# Patient Record
Sex: Female | Born: 2000 | Race: Black or African American | Hispanic: No | Smoking: Never smoker
Health system: Southern US, Community
[De-identification: ages and names within clinical notes are randomized; demographics above are authoritative.]

## PROBLEM LIST (undated history)

## (undated) DIAGNOSIS — F988 Other specified behavioral and emotional disorders with onset usually occurring in childhood and adolescence: Secondary | ICD-10-CM

## (undated) HISTORY — PX: TONSILLECTOMY: SUR1361

## (undated) HISTORY — DX: Other specified behavioral and emotional disorders with onset usually occurring in childhood and adolescence: F98.8

## (undated) HISTORY — PX: ADENOIDECTOMY: SHX5191

---
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
## 2018-04-01 ENCOUNTER — Ambulatory Visit: Payer: BC Managed Care – PPO | Admitting: Adult Health

## 2018-04-01 ENCOUNTER — Encounter: Payer: Self-pay | Admitting: Adult Health

## 2018-04-01 VITALS — BP 134/83 | HR 89 | Ht 65.0 in | Wt 243.0 lb

## 2018-04-01 DIAGNOSIS — F32A Depression, unspecified: Secondary | ICD-10-CM

## 2018-04-01 DIAGNOSIS — L678 Other hair color and hair shaft abnormalities: Secondary | ICD-10-CM | POA: Insufficient documentation

## 2018-04-01 DIAGNOSIS — F329 Major depressive disorder, single episode, unspecified: Secondary | ICD-10-CM

## 2018-04-01 DIAGNOSIS — N926 Irregular menstruation, unspecified: Secondary | ICD-10-CM | POA: Insufficient documentation

## 2018-04-01 DIAGNOSIS — R635 Abnormal weight gain: Secondary | ICD-10-CM

## 2018-04-01 NOTE — Progress Notes (Signed)
  Subjective:     Patient ID: Cheryl Byrd, female   DOB: 2001/07/25, 17 y.o.   MRN: [id number]  HPI Cheryl Byrd is a 17 year old black female in with her Mom, for complaints of weight gain about 30 lbs in last year, facial hair and irregular periods, and had hair loss. She has finished first year of college and has stopped completive dance, and Grandfather died, in last year or so. She has seen PCP and is on Wellbutrin for depression, and is better, mom says. She has seen dermatologist and endocrinologist in past. She says she started at age 17 and periods have never been exactly regular, has every month now but cycle varies.PCP thinks could be PCOS.[facility].  [facility]  Review of Systems  +facila hair +irregualr periods +weight gain Has had hair loss +derpession Reviewed past medical,surgical, social and family history. Reviewed medications and allergies.     Objective:   Physical Exam BP 134/83 (BP Location: Left Arm, Patient Position: Sitting, Cuff Size: Large)   Pulse 89   Ht 5\' 5"  (1.651 m)   Wt 243 lb (110.2 kg)   LMP 03/11/2018 (Approximate)   BMI 40.44 kg/m  Skin warm and dry. Neck: mid line trachea, normal thyroid, good ROM, no lymphadenopathy noted. Lungs: clear to ausculation bilaterally. Cardiovascular: regular rate and rhythm.Has some hair on chin,she plucks them out. PHQ 9 score 8, is on Wellbutrin for about a month now and is better, denies being suicidal. She had labs on her phone and I reviewed results, HGB 13.7,TSH 2.080,LH 3.1, Total testosterone 3.77, TPO 12,Sex binding hormone 34.1 Ferritin 46, iron 80.  Will get US to assess ovaries, and will talk when results back, discussed ovaries could be normal but have some metabolic changes going on,and may check more labs.   Face time 30 minutes with 50% counseling and reviewing records.  Assessment:     1. Irregular periods   2. Weight gain   3. Abnormal facial hair   4. Depression,  unspecified depression type       Plan:     Get GYN US in 1 week Will talk when results back, may check blood sugar and fasting insulin May try metformin and low dose OC Review handout on PCOs and diet for PCOS

## 2018-04-01 NOTE — Patient Instructions (Signed)
Diet for Polycystic Ovarian Syndrome Polycystic ovary syndrome (PCOS) is a disorder of the chemical messengers (hormones) that regulate menstruation. The condition causes important hormones to be out of balance. PCOS can:  Make your periods irregular or stop.  Cause cysts to develop on the ovaries.  Make it difficult to get pregnant.  Stop your body from responding to the effects of insulin (insulin resistance), which can lead to obesity and diabetes.  Changing what you eat can help manage PCOS and improve your health. It can help you lose weight and improve the way your body uses insulin. What is my plan?  Eat breakfast, lunch, and dinner plus two snacks every day.  Include protein in each meal and snack.  Choose whole grains instead of products made with refined flour.  Eat a variety of foods.  Exercise regularly as told by your health care provider. What do I need to know about this eating plan? If you are overweight or obese, pay attention to how many calories you eat. Cutting down on calories can help you lose weight. Work with your health care provider or dietitian to figure out how many calories you need each day. What foods can I eat? Grains Whole grains, such as whole wheat. Whole-grain breads, crackers, cereals, and pasta. Unsweetened oatmeal, bulgur, barley, quinoa, or brown rice. Corn or whole-wheat flour tortillas. Vegetables  Lettuce. Spinach. Peas. Beets. Cauliflower. Cabbage. Broccoli. Carrots. Tomatoes. Squash. Eggplant. Herbs. Peppers. Onions. Cucumbers. Brussels sprouts. Fruits Berries. Bananas. Apples. Oranges. Grapes. Papaya. Mango. Pomegranate. Kiwi. Grapefruit. Cherries. Meats and Other Protein Sources Lean proteins, such as fish, chicken, beans, eggs, and tofu. Dairy Low-fat dairy products, such as skim milk, cheese sticks, and yogurt. Beverages Low-fat or fat-free drinks, such as water, low-fat milk, sugar-free drinks, and 100% fruit  juice. Condiments Ketchup. Mustard. Barbecue sauce. Relish. Low-fat or fat-free mayonnaise. Fats and Oils Olive oil or canola oil. Walnuts and almonds. The items listed above may not be a complete list of recommended foods or beverages. Contact your dietitian for more options. What foods are not recommended? Foods high in calories or fat. Fried foods. Sweets. Products made from refined white flour, including white bread, pastries, white rice, and pasta. The items listed above may not be a complete list of foods and beverages to avoid. Contact your dietitian for more information. This information is not intended to replace advice given to you by your health care provider. Make sure you discuss any questions you have with your health care provider. Document Released: 12/20/2015 Document Revised: 02/03/2016 Document Reviewed: 09/09/2014 Elsevier Interactive Patient Education  2018 Elsevier Inc.  Polycystic Ovarian Syndrome Polycystic ovarian syndrome (PCOS) is a common hormonal disorder among women of reproductive age. In most women with PCOS, many small fluid-filled sacs (cysts) grow on the ovaries, and the cysts are not part of a normal menstrual cycle. PCOS can cause problems with your menstrual periods and make it difficult to get pregnant. It can also cause an increased risk of miscarriage with pregnancy. If it is not treated, PCOS can lead to serious health problems, such as diabetes and heart disease. What are the causes? The cause of PCOS is not known, but it may be the result of a combination of certain factors, such as:  Irregular menstrual cycle.  High levels of certain hormones (androgens).  Problems with the hormone that helps to control blood sugar (insulin resistance).  Certain genes.  What increases the risk? This condition is more likely to develop in women who   have a family history of PCOS. What are the signs or symptoms? Symptoms of PCOS may include:  Multiple ovarian  cysts.  Infrequent periods or no periods.  Periods that are too frequent or too heavy.  Unpredictable periods.  Inability to get pregnant (infertility) because of not ovulating.  Increased growth of hair on the face, chest, stomach, back, thumbs, thighs, or toes.  Acne or oily skin. Acne may develop during adulthood, and it may not respond to treatment.  Pelvic pain.  Weight gain or obesity.  Patches of thickened and dark brown or black skin on the neck, arms, breasts, or thighs (acanthosis nigricans).  Excess hair growth on the face, chest, abdomen, or upper thighs (hirsutism).  How is this diagnosed? This condition is diagnosed based on:  Your medical history.  A physical exam, including a pelvic exam. Your health care provider may look for areas of increased hair growth on your skin.  Tests, such as: ? Ultrasound. This may be used to examine the ovaries and the lining of the uterus (endometrium) for cysts. ? Blood tests. These may be used to check levels of sugar (glucose), female hormone (testosterone), and female hormones (estrogen and progesterone) in your blood.  How is this treated? There is no cure for PCOS, but treatment can help to manage symptoms and prevent more health problems from developing. Treatment varies depending on:  Your symptoms.  Whether you want to have a baby or whether you need birth control (contraception).  Treatment may include nutrition and lifestyle changes along with:  Progesterone hormone to start a menstrual period.  Birth control pills to help you have regular menstrual periods.  Medicines to make you ovulate, if you want to get pregnant.  Medicine to reduce excessive hair growth.  Surgery, in severe cases. This may involve making small holes in one or both of your ovaries. This decreases the amount of testosterone that your body produces.  Follow these instructions at home:  Take over-the-counter and prescription medicines only  as told by your health care provider.  Follow a healthy meal plan. This can help you reduce the effects of PCOS. ? Eat a healthy diet that includes lean proteins, complex carbohydrates, fresh fruits and vegetables, low-fat dairy products, and healthy fats. Make sure to eat enough fiber.  If you are overweight, lose weight as told by your health care provider. ? Losing 10% of your body weight may improve symptoms. ? Your health care provider can determine how much weight loss is best for you and can help you lose weight safely.  Keep all follow-up visits as told by your health care provider. This is important. Contact a health care provider if:  Your symptoms do not get better with medicine.  You develop new symptoms. This information is not intended to replace advice given to you by your health care provider. Make sure you discuss any questions you have with your health care provider. Document Released: 12/22/2004 Document Revised: 04/25/2016 Document Reviewed: 02/13/2016 Elsevier Interactive Patient Education  2018 Elsevier Inc.  

## 2018-04-10 ENCOUNTER — Ambulatory Visit (INDEPENDENT_AMBULATORY_CARE_PROVIDER_SITE_OTHER): Payer: BC Managed Care – PPO

## 2018-04-10 ENCOUNTER — Telehealth: Payer: Self-pay | Admitting: Adult Health

## 2018-04-10 DIAGNOSIS — N926 Irregular menstruation, unspecified: Secondary | ICD-10-CM

## 2018-04-10 MED ORDER — DESOGESTREL-ETHINYL ESTRADIOL 0.15-30 MG-MCG PO TABS: 1.0000 | ORAL_TABLET | Freq: Every day | ORAL | 4 refills | Status: DC

## 2018-04-10 MED ORDER — METFORMIN HCL 500 MG PO TABS: 500.0000 mg | ORAL_TABLET | Freq: Every day | ORAL | 1 refills | Status: DC

## 2018-04-10 NOTE — Telephone Encounter (Signed)
Pt aware that US normal, will rx apri and metformin , work on weight and F/U [place] home [place] college around [place]

## 2018-04-10 NOTE — Progress Notes (Signed)
PELVIC US TA/TV:homogeneous anteverted uterus,wnl,normal ovaries bilat,EEC 6.8 mm,ovaries appear mobile,no pain during ultrasound,small amount of cul de sac fluid

## 2018-10-03 ENCOUNTER — Other Ambulatory Visit: Payer: Self-pay | Admitting: Adult Health

## 2018-12-16 ENCOUNTER — Ambulatory Visit: Payer: BC Managed Care – PPO | Admitting: Adult Health

## 2018-12-16 ENCOUNTER — Encounter: Payer: Self-pay | Admitting: Adult Health

## 2018-12-16 ENCOUNTER — Other Ambulatory Visit: Payer: Self-pay

## 2018-12-16 VITALS — BP 125/82 | HR 92 | Temp 99.5°F | Ht 65.0 in | Wt 252.0 lb

## 2018-12-16 DIAGNOSIS — L02213 Cutaneous abscess of chest wall: Secondary | ICD-10-CM | POA: Diagnosis not present

## 2018-12-16 MED ORDER — SULFAMETHOXAZOLE-TRIMETHOPRIM 800-160 MG PO TABS
1.0000 | ORAL_TABLET | Freq: Two times a day (BID) | ORAL | 0 refills | Status: DC
Start: 2018-12-16 — End: 2022-01-27

## 2018-12-16 NOTE — Progress Notes (Signed)
Patient ID: Cheryl Byrd, female   DOB: May 03, 2001, 18 y.o.   MRN: [id number] History of Present Illness: Ty is a 18 year old black female in complaining of bump on chest between breasts, that is tender. PCP is Dr Phillips Odor.   Current Medications, Allergies, Past Medical History, Past Surgical History, Family History and Social History were reviewed in [employer] record.     Review of Systems: Has bump on chest between breasts that is tender, has had once before and it resolved.  She denies any chest pain, shortness of breath or cough.   Physical Exam:BP 125/82 (BP Location: Left Arm, Patient Position: Sitting, Cuff Size: Large)   Pulse 92   Temp 99.5 F (37.5 C)   Ht 5\' 5"  (1.651 m)   Wt 252 lb (114.3 kg)   LMP 12/09/2018   BMI 41.93 kg/m  General:  Well developed, well nourished, no acute distress Skin:  Warm and dry Chest:between breasts has 2 cm firm area that when squeezed expressed greenish fluid, culture obtained.No redness noted, it is tender.  Psych:  No mood changes, alert and cooperative,seems happy Fall risk is low.  Examination chaperoned by Federico Flake CMA.   Impression: 1. Abscess of chest wall  - Wound culture - Rx septra ds 1 bid x 14 days   Plan: Wound culture sent Meds ordered this encounter  Medications  . sulfamethoxazole-trimethoprim (BACTRIM DS,SEPTRA DS) 800-160 MG tablet    Sig: Take 1 tablet by mouth 2 (two) times daily.    Dispense:  28 tablet    Refill:  0    Order Specific Question:   Supervising Provider    Answer:   Duane Lope H [[id number]]  Use warm compresses and massage Use antibacterial soap Review handout on abscess  F/U prn, if does not resolve, will refer to surgeon for excision.

## 2018-12-16 NOTE — Patient Instructions (Signed)
Skin Abscess  A skin abscess is an infected area on or under your skin that contains a collection of pus and other material. An abscess may also be called a furuncle, carbuncle, or boil. An abscess can occur in or on almost any part of your body. Some abscesses break open (rupture) on their own. Most continue to get worse unless they are treated. The infection can spread deeper into the body and eventually into your blood, which can make you feel ill. Treatment usually involves draining the abscess. What are the causes? An abscess occurs when germs, like bacteria, pass through your skin and cause an infection. This may be caused by:  A scrape or cut on your skin.  A puncture wound through your skin, including a needle injection or insect bite.  Blocked oil or sweat glands.  Blocked and infected hair follicles.  A cyst that forms beneath your skin (sebaceous cyst) and becomes infected. What increases the risk? This condition is more likely to develop in people who:  Have a weak body defense system (immune system).  Have diabetes.  Have dry and irritated skin.  Get frequent injections or use illegal IV drugs.  Have a foreign body in a wound, such as a splinter.  Have problems with their lymph system or veins. What are the signs or symptoms? Symptoms of this condition include:  A painful, firm bump under the skin.  A bump with pus at the top. This may break through the skin and drain. Other symptoms include:  Redness surrounding the abscess site.  Warmth.  Swelling of the lymph nodes (glands) near the abscess.  Tenderness.  A sore on the skin. How is this diagnosed? This condition may be diagnosed based on:  A physical exam.  Your medical history.  A sample of pus. This may be used to find out what is causing the infection.  Blood tests.  Imaging tests, such as an ultrasound, CT scan, or MRI. How is this treated? A small abscess that drains on its own may not  need treatment. Treatment for larger abscesses may include:  Moist heat or heat pack applied to the area several times a day.  A procedure to drain the abscess (incision and drainage).  Antibiotic medicines. For a severe abscess, you may first get antibiotics through an IV and then change to antibiotics by mouth. Follow these instructions at home: Medicines   Take over-the-counter and prescription medicines only as told by your health care provider.  If you were prescribed an antibiotic medicine, take it as told by your health care provider. Do not stop taking the antibiotic even if you start to feel better. Abscess care   If you have an abscess that has not drained, apply heat to the affected area. Use the heat source that your health care provider recommends, such as a moist heat pack or a heating pad. ? Place a towel between your skin and the heat source. ? Leave the heat on for 20-30 minutes. ? Remove the heat if your skin turns bright red. This is especially important if you are unable to feel pain, heat, or cold. You may have a greater risk of getting burned.  Follow instructions from your health care provider about how to take care of your abscess. Make sure you: ? Cover the abscess with a bandage (dressing). ? Change your dressing or gauze as told by your health care provider. ? Wash your hands with soap and water before you change the   dressing or gauze. If soap and water are not available, use hand sanitizer.  Check your abscess every day for signs of a worsening infection. Check for: ? More redness, swelling, or pain. ? More fluid or blood. ? Warmth. ? More pus or a bad smell. General instructions  To avoid spreading the infection: ? Do not share personal care items, towels, or hot tubs with others. ? Avoid making skin contact with other people.  Keep all follow-up visits as told by your health care provider. This is important. Contact a health care provider if you  have:  More redness, swelling, or pain around your abscess.  More fluid or blood coming from your abscess.  Warm skin around your abscess.  More pus or a bad smell coming from your abscess.  A fever.  Muscle aches.  Chills or a general ill feeling. Get help right away if you:  Have severe pain.  See red streaks on your skin spreading away from the abscess. Summary  A skin abscess is an infected area on or under your skin that contains a collection of pus and other material.  A small abscess that drains on its own may not need treatment.  Treatment for larger abscesses may include having a procedure to drain the abscess and taking an antibiotic. This information is not intended to replace advice given to you by your health care provider. Make sure you discuss any questions you have with your health care provider. Document Released: 06/07/2005 Document Revised: 10/11/2017 Document Reviewed: 10/11/2017 Elsevier Interactive Patient Education  2019 Elsevier Inc.  

## 2018-12-18 LAB — WOUND CULTURE

## 2019-05-10 ENCOUNTER — Other Ambulatory Visit: Payer: Self-pay | Admitting: Adult Health

## 2019-05-12 ENCOUNTER — Other Ambulatory Visit: Payer: Self-pay | Admitting: *Deleted

## 2019-05-12 MED ORDER — DESOGESTREL-ETHINYL ESTRADIOL 0.15-30 MG-MCG PO TABS: 1.0000 | ORAL_TABLET | Freq: Every day | ORAL | 4 refills | Status: DC

## 2019-09-24 ENCOUNTER — Other Ambulatory Visit: Payer: Self-pay

## 2019-09-24 ENCOUNTER — Ambulatory Visit: Payer: BC Managed Care – PPO | Attending: Internal Medicine

## 2019-09-24 DIAGNOSIS — Z20822 Contact with and (suspected) exposure to covid-19: Secondary | ICD-10-CM

## 2019-09-25 LAB — NOVEL CORONAVIRUS, NAA: SARS-CoV-2, NAA: NOT DETECTED

## 2020-03-08 ENCOUNTER — Inpatient Hospital Stay (HOSPITAL_COMMUNITY): Admission: RE | Admit: 2020-03-08 | Payer: BC Managed Care – PPO | Source: Ambulatory Visit

## 2020-06-16 ENCOUNTER — Telehealth: Payer: BC Managed Care – PPO | Admitting: Adult Health

## 2020-09-23 ENCOUNTER — Other Ambulatory Visit: Payer: BC Managed Care – PPO | Admitting: Adult Health

## 2022-01-04 DIAGNOSIS — M778 Other enthesopathies, not elsewhere classified: Secondary | ICD-10-CM | POA: Insufficient documentation

## 2022-01-18 ENCOUNTER — Ambulatory Visit
Admission: EM | Admit: 2022-01-18 | Discharge: 2022-01-18 | Disposition: A | Discharge status: Home / Self Care | Payer: BC Managed Care – PPO

## 2022-01-18 ENCOUNTER — Ambulatory Visit (INDEPENDENT_AMBULATORY_CARE_PROVIDER_SITE_OTHER): Payer: BC Managed Care – PPO

## 2022-01-18 DIAGNOSIS — M25531 Pain in right wrist: Secondary | ICD-10-CM

## 2022-01-18 MED ORDER — PREDNISONE 20 MG PO TABS: 40.0000 mg | ORAL_TABLET | Freq: Every day | ORAL | 0 refills | Status: DC

## 2022-01-18 NOTE — ED Provider Notes (Signed)
?[facility] ? ? ? ?CSN: [id number] ?Arrival date & time: 01/18/22  1121 ? ? ?  ? ?History   ?Chief Complaint ?Chief Complaint  ?Patient presents with  ? Wrist Pain  ?  Right wrist pain   ? ? ?HPI ?Cheryl Byrd is a 21 y.o. female.  ? ?Presenting today with right wrist pain for the past week with no known injury.  Denies swelling, discoloration, numbness, tingling.  Significantly worse with movement, lifting.  Trying ibuprofen and Tylenol with minimal relief. ? ? ?History reviewed. No pertinent past medical history. ? ?Patient Active Problem List  ? Diagnosis Date Noted  ? Irregular periods 04/01/2018  ? Weight gain 04/01/2018  ? Abnormal facial hair 04/01/2018  ? Obesity (BMI 30.0-34.9) 01/18/2017  ? Essential tremor 10/25/2015  ? Left knee pain 05/22/2012  ? ? ?Past Surgical History:  ?Procedure Laterality Date  ? ADENOIDECTOMY    ? TONSILLECTOMY    ? ? ?OB History   ? ? Gravida  ?0  ? Para  ?0  ? Term  ?0  ? Preterm  ?0  ? AB  ?0  ? Living  ?0  ?  ? ? SAB  ?0  ? IAB  ?0  ? Ectopic  ?0  ? Multiple  ?0  ? Live Births  ?0  ?   ?  ?  ? ? ? ?Home Medications   ? ?Prior to Admission medications   ?Medication Sig Start Date End Date Taking? Authorizing Provider  ?amphetamine-dextroamphetamine (ADDERALL XR) 20 MG 24 hr capsule Take 20 mg by mouth daily.   Yes [provider]  ?predniSONE (DELTASONE) 20 MG tablet Take 2 tablets (40 mg total) by mouth daily with breakfast. 01/18/22  Yes Volney American, PA-C  ?buPROPion (WELLBUTRIN XL) 150 MG 24 hr tablet TK 1 T PO QD 03/15/18   [provider]  ?buPROPion (WELLBUTRIN XL) 300 MG 24 hr tablet Take 300 mg by mouth daily. 11/21/21   [provider]  ?desogestrel-ethinyl estradiol (APRI) 0.15-30 MG-MCG tablet Take 1 tablet by mouth daily. 05/12/19   Roma Schanz, CNM  ?iron polysaccharides (NIFEREX) 150 MG capsule Take 150 mg by mouth daily.    [provider]  ?metFORMIN (GLUCOPHAGE) 500 MG tablet TAKE 1 TABLET BY  MOUTH EVERY MORNING WITH FOOD 10/03/18   Derrek Monaco A, NP  ?sulfamethoxazole-trimethoprim (BACTRIM DS,SEPTRA DS) 800-160 MG tablet Take 1 tablet by mouth 2 (two) times daily. 12/16/18   Estill Dooms, NP  ?zonisamide (ZONEGRAN) 50 MG capsule Take 150 mg by mouth daily. 12/07/21   [provider]  ? ? ?Family History ?Family History  ?Problem Relation Age of Onset  ? Breast cancer Maternal Grandmother   ? Thyroid disease Maternal Grandmother   ? Heart attack Maternal Grandfather   ? Diabetes Maternal Grandfather   ? Thyroid disease Paternal Grandmother   ? Diabetes Paternal Grandfather   ? Thyroid disease Paternal Aunt   ? ? ?Social History ?Social History  ? ?Tobacco Use  ? Smoking status: Never  ?  Passive exposure: Never  ? Smokeless tobacco: Never  ?Vaping Use  ? Vaping Use: Never used  ?Substance Use Topics  ? Alcohol use: No  ?  Alcohol/week: 0.0 standard drinks  ? Drug use: No  ? ? ? ?Allergies   ?Patient has no known allergies. ? ? ?Review of Systems ?Review of Systems ?Per HPI ? ?Physical Exam ?Triage Vital Signs ?ED Triage Vitals  ?Enc Vitals  Group  ?   BP 01/18/22 1157 (!) 134/94  ?   Pulse Rate 01/18/22 1157 100  ?   Resp 01/18/22 1157 20  ?   Temp 01/18/22 1157 99.3 ?F (37.4 ?C)  ?   Temp Source 01/18/22 1157 Oral  ?   SpO2 01/18/22 1157 98 %  ?   Weight --   ?   Height --   ?   Head Circumference --   ?   Peak Flow --   ?   Pain Score 01/18/22 1159 7  ?   Pain Loc --   ?   Pain Edu? --   ?   Excl. in [place]? --   ? ?No data found. ? ?Updated Vital Signs ?BP (!) 134/94 (BP Location: Right Arm)   Pulse 100   Temp 99.3 ?F (37.4 ?C) (Oral)   Resp 20   LMP 01/09/2022 (Approximate)   SpO2 98%  ? ?Visual Acuity ?Right Eye Distance:   ?Left Eye Distance:   ?Bilateral Distance:   ? ?Right Eye Near:   ?Left Eye Near:    ?Bilateral Near:    ? ?Physical Exam ?Vitals and nursing note reviewed.  ?Constitutional:   ?   Appearance: Normal appearance. She is not ill-appearing.  ?HENT:  ?   Head:  Atraumatic.  ?Eyes:  ?   Extraocular Movements: Extraocular movements intact.  ?   Conjunctiva/sclera: Conjunctivae normal.  ?Cardiovascular:  ?   Rate and Rhythm: Normal rate and regular rhythm.  ?   Heart sounds: Normal heart sounds.  ?Pulmonary:  ?   Effort: Pulmonary effort is normal.  ?   Breath sounds: Normal breath sounds.  ?Musculoskeletal:     ?   General: Tenderness present. No swelling, deformity or signs of injury. Normal range of motion.  ?   Cervical back: Normal range of motion and neck supple.  ?   Comments: Diffuse tenderness to palpation right wrist and forearm.  Range of motion intact, strength full and equal bilateral hands  ?Skin: ?   General: Skin is warm and dry.  ?   Findings: No bruising or erythema.  ?Neurological:  ?   Mental Status: She is alert and oriented to person, place, and time.  ?   Motor: No weakness.  ?   Gait: Gait normal.  ?   Comments: Right upper extremity neurovascularly intact  ?Psychiatric:     ?   Mood and Affect: Mood normal.     ?   Thought Content: Thought content normal.     ?   Judgment: Judgment normal.  ? ? ? ?UC Treatments / Results  ?Labs ?(all labs ordered are listed, but only abnormal results are displayed) ?Labs Reviewed - No data to display ? ?EKG ? ? ?Radiology ?DG Wrist Complete Right ? ?Result Date: 01/18/2022 ?CLINICAL DATA:  several days of right wrist pain, no known injury EXAM: RIGHT WRIST - COMPLETE 3+ VIEW COMPARISON:  None Available. FINDINGS: No evidence of acute fracture or joint malalignment. Mild narrowing of the radiocarpal joint and mild triscaphe degenerative change. Question mild soft tissue stranding/edema. IMPRESSION: No evidence of acute bony abnormality. Electronically Signed   By: Margaretha Sheffield M.D.   On: 01/18/2022 13:08   ? ?Procedures ?Procedures (including critical care time) ? ?Medications Ordered in UC ?Medications - No data to display ? ?Initial Impression / Assessment and Plan / UC Course  ?I have reviewed the triage vital  signs and the nursing notes. ? ?  Pertinent labs & imaging results that were available during my care of the patient were reviewed by me and considered in my medical decision making (see chart for details). ? ?  ? ?X-ray of the right wrist negative for acute bony abnormality, suspect tendinitis type injury.  Treat with prednisone, Flexeril which she states she already has at home, warm Epsom salt soaks, Kinesiotape, wrist compression brace.  Discussed return precautions for acutely worsening symptoms. ? ?Final Clinical Impressions(s) / UC Diagnoses  ? ?Final diagnoses:  ?Right wrist pain  ? ?Discharge Instructions   ?None ?  ? ?ED Prescriptions   ? ? Medication Sig Dispense Auth. Provider  ? predniSONE (DELTASONE) 20 MG tablet Take 2 tablets (40 mg total) by mouth daily with breakfast. 10 tablet Volney American, PA-C  ? ?  ? ?PDMP not reviewed this encounter. ?  ?Volney American, PA-C ?01/18/22 1422 ? ?

## 2022-01-18 NOTE — ED Triage Notes (Signed)
Pt states for the past week she has not been able to put pressure on her right hand and wrist ? ?Pt states that she feels tingling going down to her elbow ? ?Denies Injury ?

## 2022-01-24 ENCOUNTER — Encounter: Payer: Self-pay | Admitting: Radiology

## 2022-01-27 ENCOUNTER — Ambulatory Visit: Payer: BC Managed Care – PPO

## 2022-01-27 ENCOUNTER — Ambulatory Visit: Payer: BC Managed Care – PPO | Admitting: Orthopedic Surgery

## 2022-01-27 ENCOUNTER — Ambulatory Visit (INDEPENDENT_AMBULATORY_CARE_PROVIDER_SITE_OTHER): Payer: BC Managed Care – PPO

## 2022-01-27 ENCOUNTER — Encounter: Payer: Self-pay | Admitting: Orthopedic Surgery

## 2022-01-27 VITALS — BP 158/100 | HR 99 | Ht 63.5 in | Wt 260.8 lb

## 2022-01-27 DIAGNOSIS — M2142 Flat foot [pes planus] (acquired), left foot: Secondary | ICD-10-CM

## 2022-01-27 DIAGNOSIS — M2141 Flat foot [pes planus] (acquired), right foot: Secondary | ICD-10-CM

## 2022-01-27 DIAGNOSIS — M79671 Pain in right foot: Secondary | ICD-10-CM

## 2022-01-27 NOTE — Patient Instructions (Signed)
Recommend prescription orthotics for bilateral flatfeet.  If you have issues obtaining the orthotics, please contact clinic.  Continue with medications as needed for your feet.  If you continue to have difficulties, I would recommend a referral to Dr. Meridee Score in [place].  Please contact the clinic if you wish to proceed with this referral.

## 2022-01-27 NOTE — Progress Notes (Signed)
New Patient Visit  Assessment: Cheryl Byrd is a 21 y.o. female with the following: 1. Pes planus of both feet  Plan: Cheryl Byrd has severe bilateral flatfoot deformity, in the setting of Ehlers-Danlos syndrome.  As has been progressively worsening for for many years.  She has tried medications, as well as some shoe modifications.  I discussed both pes planus deformity, as well as issues associated with Ehlers-Danlos.  I have recommended appropriate shoe orthotics or a referral to a foot and ankle specialist.  She would like to try the orthotics at this point.  Depending on the success of the orthotics, we will likely have to refer her to Dr. Lajoyce Corners for further evaluation.  Follow-up as needed.  Follow-up: Return if symptoms worsen or fail to improve.  Subjective:  Chief Complaint  Patient presents with   New Patient (Initial Visit)    Bilateral foot pain   Foot Pain    Bilateral/ have been painful entire life/ danced for 17 years/ has a connective tissue disorder Pain has progressively gotten worse in the last 6 mths    History of Present Illness: Cheryl Byrd is a 21 y.o. female who presents for evaluation of bilateral foot pain.  She carries a diagnosis of Ehlers-Danlos syndrome.  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  She has had flatfeet, which have progressively worsened.  She has had pain for many years.  However, over the past 6 months, they have progressively worsened.  She is on her feet throughout the day.  She has tried shoe modifications, and is taking medications as needed.  Pain is in the medial aspect of both feet.  No specific injuries.   Review of Systems: No fevers or chills No numbness or tingling No chest pain No shortness of breath No bowel or bladder dysfunction No GI distress No headaches   Medical History:  Ehlers-Danlos syndrome  Past Surgical History:  Procedure Laterality Date   ADENOIDECTOMY     TONSILLECTOMY       Family History  Problem Relation Age of Onset   Breast cancer Maternal Grandmother    Thyroid disease Maternal Grandmother    Heart attack Maternal Grandfather    Diabetes Maternal Grandfather    Thyroid disease Paternal Grandmother    Diabetes Paternal Grandfather    Thyroid disease Paternal Aunt    Social History   Tobacco Use   Smoking status: Never    Passive exposure: Never   Smokeless tobacco: Never  Vaping Use   Vaping Use: Never used  Substance Use Topics   Alcohol use: No    Alcohol/week: 0.0 standard drinks   Drug use: No    No Known Allergies  No outpatient medications have been marked as taking for the 01/27/22 encounter (Office Visit) with Oliver Barre, MD.    Objective: BP (!) 158/100   Pulse 99   Ht 5' 3.5" (1.613 m)   Wt 260 lb 12.8 oz (118.3 kg)   LMP 01/09/2022 (Approximate)   BMI 45.47 kg/m   Physical Exam:  General: Alert and oriented. and No acute distress. Gait: Normal gait.  Evaluation of both feet demonstrates severe flatfoot deformity.  Range of motion about the ankle is good.  Tenderness palpation over the medial midfoot bilaterally.  Toes are warm and well-perfused.  Sensation is intact over the dorsum of the foot.  IMAGING: I personally ordered and reviewed the following images  Standing x-rays of bilateral feet were  obtained in clinic today.  No acute injuries are noted.  No dislocations.  She has severe flatfoot deformities bilaterally.  Impression: Bilateral pes planus deformity   New Medications:  No orders of the defined types were placed in this encounter.     Oliver Barre, MD  01/27/2022 10:06 PM

## 2022-07-27 ENCOUNTER — Telehealth: Payer: Self-pay | Admitting: Physician Assistant

## 2022-07-27 ENCOUNTER — Encounter: Payer: Self-pay | Admitting: Physician Assistant

## 2022-07-27 DIAGNOSIS — J208 Acute bronchitis due to other specified organisms: Secondary | ICD-10-CM

## 2022-07-28 MED ORDER — PROMETHAZINE-DM 6.25-15 MG/5ML PO SYRP: 5.0000 mL | ORAL_SOLUTION | Freq: Four times a day (QID) | ORAL | 0 refills | Status: DC | PRN

## 2022-07-28 MED ORDER — BENZONATATE 100 MG PO CAPS: 100.0000 mg | ORAL_CAPSULE | Freq: Three times a day (TID) | ORAL | 0 refills | Status: DC | PRN

## 2022-07-28 MED ORDER — PREDNISONE 10 MG (21) PO TBPK: ORAL_TABLET | ORAL | 0 refills | Status: DC

## 2022-07-28 NOTE — Progress Notes (Signed)
Duplicate encounter

## 2022-07-28 NOTE — Telephone Encounter (Signed)
This encounter was created in error - please disregard.

## 2022-07-28 NOTE — Progress Notes (Signed)
We are sorry that you are not feeling well.  Here is how we plan to help!  Based on your presentation I believe you most likely have A cough due to a virus.  This is called viral bronchitis and is best treated by rest, plenty of fluids and control of the cough.  You may use Ibuprofen or Tylenol as directed to help your symptoms.     In addition you may use A prescription cough medication called Tessalon Perles 100mg. You may take 1-2 capsules every 8 hours as needed for your cough. And Promethazine DM cough syrup Take 5mL every 6 hours as needed for cough  Prednisone 10 mg daily for 6 days (see taper instructions below)  Directions for 6 day taper: Day 1: 2 tablets before breakfast, 1 after both lunch & dinner and 2 at bedtime Day 2: 1 tab before breakfast, 1 after both lunch & dinner and 2 at bedtime Day 3: 1 tab at each meal & 1 at bedtime Day 4: 1 tab at breakfast, 1 at lunch, 1 at bedtime Day 5: 1 tab at breakfast & 1 tab at bedtime Day 6: 1 tab at breakfast  From your responses in the eVisit questionnaire you describe inflammation in the upper respiratory tract which is causing a significant cough.  This is commonly called Bronchitis and has four common causes:   Allergies Viral Infections Acid Reflux Bacterial Infection Allergies, viruses and acid reflux are treated by controlling symptoms or eliminating the cause. An example might be a cough caused by taking certain blood pressure medications. You stop the cough by changing the medication. Another example might be a cough caused by acid reflux. Controlling the reflux helps control the cough.  USE OF BRONCHODILATOR ("RESCUE") INHALERS: There is a risk from using your bronchodilator too frequently.  The risk is that over-reliance on a medication which only relaxes the muscles surrounding the breathing tubes can reduce the effectiveness of medications prescribed to reduce swelling and congestion of the tubes themselves.  Although you feel  brief relief from the bronchodilator inhaler, your asthma may actually be worsening with the tubes becoming more swollen and filled with mucus.  This can delay other crucial treatments, such as oral steroid medications. If you need to use a bronchodilator inhaler daily, several times per day, you should discuss this with your provider.  There are probably better treatments that could be used to keep your asthma under control.     HOME CARE Only take medications as instructed by your medical team. Complete the entire course of an antibiotic. Drink plenty of fluids and get plenty of rest. Avoid close contacts especially the very young and the elderly Cover your mouth if you cough or cough into your sleeve. Always remember to wash your hands A steam or ultrasonic humidifier can help congestion.   GET HELP RIGHT AWAY IF: You develop worsening fever. You become short of breath You cough up blood. Your symptoms persist after you have completed your treatment plan MAKE SURE YOU  Understand these instructions. Will watch your condition. Will get help right away if you are not doing well or get worse.    Thank you for choosing an e-visit.  Your e-visit answers were reviewed by a board certified advanced clinical practitioner to complete your personal care plan. Depending upon the condition, your plan could have included both over the counter or prescription medications.  Please review your pharmacy choice. Make sure the pharmacy is open so you can   pick up prescription now. If there is a problem, you may contact your provider through MyChart messaging and have the prescription routed to another pharmacy.  Your safety is important to us. If you have drug allergies check your prescription carefully.   For the next 24 hours you can use MyChart to ask questions about today's visit, request a non-urgent call back, or ask for a work or school excuse. You will get an email in the next two days asking  about your experience. I hope that your e-visit has been valuable and will speed your recovery.  I have spent 5 minutes in review of e-visit questionnaire, review and updating patient chart, medical decision making and response to patient.   Denese Mentink M Mathius Birkeland, PA-C  

## 2022-08-04 ENCOUNTER — Telehealth: Payer: Self-pay | Admitting: Physician Assistant

## 2022-08-04 DIAGNOSIS — J019 Acute sinusitis, unspecified: Secondary | ICD-10-CM

## 2022-08-04 DIAGNOSIS — B9689 Other specified bacterial agents as the cause of diseases classified elsewhere: Secondary | ICD-10-CM

## 2022-08-04 MED ORDER — AMOXICILLIN-POT CLAVULANATE 875-125 MG PO TABS: 1.0000 | ORAL_TABLET | Freq: Two times a day (BID) | ORAL | 0 refills | Status: DC

## 2022-08-04 NOTE — Progress Notes (Signed)

## 2022-11-09 ENCOUNTER — Encounter: Payer: Self-pay | Admitting: Radiology

## 2022-11-10 HISTORY — PX: WISDOM TOOTH EXTRACTION: SHX21

## 2023-09-03 ENCOUNTER — Inpatient Hospital Stay (HOSPITAL_COMMUNITY): Admission: RE | Admit: 2023-09-03 | Payer: Self-pay | Source: Ambulatory Visit

## 2023-09-03 DIAGNOSIS — Z1231 Encounter for screening mammogram for malignant neoplasm of breast: Secondary | ICD-10-CM

## 2023-09-20 NOTE — Progress Notes (Signed)
  Intake history:  BP (!) 133/93   Pulse 93   Ht 5' 5 (1.651 m)   Wt 263 lb (119.3 kg)   BMI 43.77 kg/m  Body mass index is 43.77 kg/m.    WHAT ARE WE SEEING YOU FOR TODAY has back and leg pain bilaterla   Both legs per patient history has seen Dr Irving at [facility] ortho and dx with Mertha / Danlos syndrom no records available for review   How long has this bothered you?   Long duration   Anticoag.  No  Diabetes No  Heart disease No  Hypertension No  SMOKING HX No  Kidney disease No  Any ALLERGIES ______________________________________________   Treatment:  Have you taken:  Tylenol No  Advil No  Had PT No  Had injection No  Other  ____tried Baclofen and Flexeril but both made her sleepy _____________________

## 2023-09-24 ENCOUNTER — Other Ambulatory Visit (INDEPENDENT_AMBULATORY_CARE_PROVIDER_SITE_OTHER): Payer: BC Managed Care – PPO

## 2023-09-24 ENCOUNTER — Ambulatory Visit: Payer: 59 | Admitting: Orthopedic Surgery

## 2023-09-24 VITALS — BP 133/93 | HR 93 | Ht 65.0 in | Wt 263.0 lb

## 2023-09-24 DIAGNOSIS — M545 Low back pain, unspecified: Secondary | ICD-10-CM | POA: Diagnosis not present

## 2023-09-24 DIAGNOSIS — M25551 Pain in right hip: Secondary | ICD-10-CM | POA: Diagnosis not present

## 2023-09-24 DIAGNOSIS — M25552 Pain in left hip: Secondary | ICD-10-CM | POA: Diagnosis not present

## 2023-09-24 NOTE — Patient Instructions (Signed)
 Physical therapy has been ordered for you at [facility]. They should call you to schedule, [phone] is the phone number to call, if you want to call to schedule.

## 2023-09-24 NOTE — Progress Notes (Signed)
 Subjective:    Patient ID: Cheryl Byrd, female    DOB: 24-Sep-2000, 23 y.o.   MRN: [id number]   Intake history:   BP (!) 133/93   Pulse 93   Ht 5' 5 (1.651 m)   Wt 263 lb (119.3 kg)   BMI 43.77 kg/m  Body mass index is 43.77 kg/m.       WHAT ARE WE SEEING YOU FOR TODAY has back and leg pain bilaterla    Both legs per patient history has seen Dr Irving at [facility] ortho and dx with Mertha / Danlos syndrom no records available for review    How long has this bothered you?    Long duration    Anticoag.  No   Diabetes No   Heart disease No   Hypertension No   SMOKING HX No   Kidney disease No   Any ALLERGIES ______________________________________________     Treatment:  Have you taken:  Tylenol No  Advil No  Had PT No  Had injection No  Other  ____tried Baclofen and Flexeril but both made her sleepy _____________________ 23 year old female with Erler's Danlos syndrome comes in with complaints of back and hip pain with increased pain with Valsalva maneuver when having bowel movements.  Had some brief episodes of bilateral leg pain in the posterior aspects of the thighs which has subsequently localized to the left hip  No prior treatment other than some baclofen and Flexeril        Review of Systems  Constitutional:  Negative for fever and unexpected weight change.  Gastrointestinal: Negative.   Genitourinary: Negative.   Neurological:  Negative for numbness.       Objective:   Physical Exam Vitals and nursing note reviewed.  Constitutional:      Appearance: Normal appearance.  HENT:     Head: Normocephalic and atraumatic.  Eyes:     General: No scleral icterus.       Right eye: No discharge.        Left eye: No discharge.     Extraocular Movements: Extraocular movements intact.     Conjunctiva/sclera: Conjunctivae normal.     Pupils: Pupils are equal, round, and reactive to light.  Cardiovascular:     Rate and Rhythm: Normal rate.      Pulses: Normal pulses.  Musculoskeletal:     Lumbar back: Tenderness present. No swelling, edema, deformity, signs of trauma, lacerations, spasms or bony tenderness. Normal range of motion. Negative right straight leg raise test and negative left straight leg raise test. No scoliosis.     Comments: A little tenderness on the left side of the lumbar spine  Skin:    General: Skin is warm and dry.     Capillary Refill: Capillary refill takes less than 2 seconds.  Neurological:     General: No focal deficit present.     Mental Status: She is alert and oriented to person, place, and time.  Psychiatric:        Mood and Affect: Mood normal.        Behavior: Behavior normal.        Thought Content: Thought content normal.        Judgment: Judgment normal.     BP (!) 133/93   Pulse 93   Ht 5' 5 (1.651 m)   Wt 263 lb (119.3 kg)   BMI 43.77 kg/m         Assessment & Plan:   Encounter Diagnoses  Name  Primary?   Lumbar pain Yes   Hip pain, bilateral    Mechanical low back pain recommend physical therapy follow-up as needed

## 2023-11-27 ENCOUNTER — Telehealth: Payer: Self-pay | Admitting: Plastic Surgery

## 2023-11-27 NOTE — Telephone Encounter (Signed)
 Provider out of office 01-01-24, called and lvmail to r/s

## 2023-11-28 ENCOUNTER — Telehealth: Payer: Self-pay | Admitting: Plastic Surgery

## 2023-11-28 NOTE — Telephone Encounter (Signed)
 Called and left a VM.

## 2023-12-04 ENCOUNTER — Telehealth: Payer: Self-pay | Admitting: Plastic Surgery

## 2023-12-04 NOTE — Telephone Encounter (Signed)
 Called 3-25, left a VM, provider not in office

## 2023-12-11 ENCOUNTER — Telehealth: Payer: Self-pay | Admitting: Plastic Surgery

## 2023-12-11 NOTE — Telephone Encounter (Signed)
 pt has been called 3x, provider not in the office

## 2024-01-01 ENCOUNTER — Institutional Professional Consult (permissible substitution): Payer: 59 | Admitting: Plastic Surgery

## 2024-02-04 ENCOUNTER — Other Ambulatory Visit: Payer: Self-pay | Admitting: Medical Genetics

## 2024-02-05 ENCOUNTER — Other Ambulatory Visit (HOSPITAL_COMMUNITY)
Admission: RE | Admit: 2024-02-05 | Discharge: 2024-02-05 | Disposition: A | Discharge status: Home / Self Care | Payer: Self-pay | Source: Ambulatory Visit | Attending: Oncology | Admitting: Oncology

## 2024-02-12 LAB — GENECONNECT MOLECULAR SCREEN: Genetic Analysis Overall Interpretation: NEGATIVE

## 2024-05-06 ENCOUNTER — Encounter: Payer: Self-pay | Admitting: Adult Health

## 2024-05-06 ENCOUNTER — Ambulatory Visit: Admitting: Adult Health

## 2024-05-06 ENCOUNTER — Other Ambulatory Visit (HOSPITAL_COMMUNITY)
Admission: RE | Admit: 2024-05-06 | Discharge: 2024-05-06 | Disposition: A | Discharge status: Home / Self Care | Source: Ambulatory Visit | Attending: Adult Health | Admitting: Adult Health

## 2024-05-06 VITALS — BP 147/79 | HR 93 | Ht 65.0 in | Wt 291.5 lb

## 2024-05-06 DIAGNOSIS — Z124 Encounter for screening for malignant neoplasm of cervix: Secondary | ICD-10-CM | POA: Insufficient documentation

## 2024-05-06 DIAGNOSIS — R030 Elevated blood-pressure reading, without diagnosis of hypertension: Secondary | ICD-10-CM

## 2024-05-06 DIAGNOSIS — N926 Irregular menstruation, unspecified: Secondary | ICD-10-CM | POA: Diagnosis not present

## 2024-05-06 DIAGNOSIS — F419 Anxiety disorder, unspecified: Secondary | ICD-10-CM

## 2024-05-06 DIAGNOSIS — N898 Other specified noninflammatory disorders of vagina: Secondary | ICD-10-CM | POA: Diagnosis present

## 2024-05-06 DIAGNOSIS — F32A Depression, unspecified: Secondary | ICD-10-CM | POA: Diagnosis not present

## 2024-05-06 DIAGNOSIS — Z01419 Encounter for gynecological examination (general) (routine) without abnormal findings: Secondary | ICD-10-CM | POA: Diagnosis present

## 2024-05-06 DIAGNOSIS — Z1151 Encounter for screening for human papillomavirus (HPV): Secondary | ICD-10-CM | POA: Diagnosis not present

## 2024-05-06 MED ORDER — FLUCONAZOLE 150 MG PO TABS: ORAL_TABLET | ORAL | 1 refills | Status: AC

## 2024-05-06 NOTE — Progress Notes (Signed)
 Subjective:     Patient ID: Cheryl Byrd, female   DOB: 2001/02/26, 23 y.o.   MRN: [id number]  HPI Cheryl Byrd is a 23 year old black female, with SO, G0P0, in complaining of irregular periods, varies 3-6 weeks from 1st day to 1st day. And skipped June. She also has itching before her periods in clitoral area.  She needs a pap, this will be her first. Stressed at work.  PCP is Dr Marvine.   Review of Systems +irregular periods, varies 3-6 weeks from 1st day to 1st day. And skipped June. + itching before her periods in clitoral area.   +Stressed at work.   Reviewed past medical,surgical, social and family history. Reviewed medications and allergies.  Objective:   Physical Exam BP (!) 147/79 (BP Location: Right Arm, Patient Position: Sitting, Cuff Size: Large)   Pulse 93   Ht 5' 5 (1.651 m)   Wt 291 lb 8 oz (132.2 kg)   LMP 04/20/2024 (Exact Date)   BMI 48.51 kg/m     Skin warm and dry.  Lungs: clear to ausculation bilaterally. Cardiovascular: regular rate and rhythm.  Pelvic: external genitalia is normal in appearance no lesions, vagina: at clitoral hood, swab obtained,urethra has no lesions or masses noted, cervix:smooth, pap with GC/CHL, and HR HPV genotyping performed, uterus: normal size, shape and contour, non tender, no masses felt, adnexa: no masses or tenderness noted. Bladder is non tender and no masses felt.  AA is 0 Fall risk is low    05/06/2024   10:35 AM 04/01/2018   11:34 AM 04/01/2018   11:33 AM  Depression screen PHQ 2/9  Decreased Interest 2 1 1   Down, Depressed, Hopeless 1 2 2   PHQ - 2 Score 3 3 3   Altered sleeping 2 1   Tired, decreased energy 2 2   Change in appetite 1 1   Feeling bad or failure about yourself  1 0   Trouble concentrating 2 1   Moving slowly or fidgety/restless 1 0   Suicidal thoughts 0 0   PHQ-9 Score 12 8   Difficult doing work/chores  Not difficult at all        05/06/2024   10:35 AM  GAD 7 : Generalized Anxiety Score  Nervous,  Anxious, on Edge 1  Control/stop worrying 0  Worry too much - different things 2  Trouble relaxing 1  Restless 1  Easily annoyed or irritable 2  Afraid - awful might happen 0  Total GAD 7 Score 7    Upstream - 05/06/24 1026       Pregnancy Intention Screening   Does the patient want to become pregnant in the next year? Unsure    Does the patient's partner want to become pregnant in the next year? Unsure    Would the patient like to discuss contraceptive options today? No      Contraception Wrap Up   Current Method No Method - Other Reason    End Method No Method - Other Reason         Examination chaperoned by Clarita Salt LPN    Assessment:     1. Routine Papanicolaou smear Pap sent Pap in 3 years if normal - Cytology - PAP( [place])  2. Irregular periods (Primary) Cycles vary between 3-6 weeks, discussed this is normal cycle, 21-35 days  Will monitor for now  3. Itching in the vaginal area +itching before period +white discharge at clitoral hood Will rx diflucan  to take before  period if needed  Meds ordered this encounter  Medications   fluconazole  (DIFLUCAN ) 150 MG tablet    Sig: Take 1 po before period if needed    Dispense:  2 tablet    Refill:  1    Supervising Provider:   JAYNE MINDER H [[id number]]    - Cervicovaginal ancillary only( [place])  4. Elevated BP without diagnosis of hypertension Keep check on BP and watch salt and sugars and follow up with PCP  5. Anxiety and depression She has rx from PCP but not taking currently  Does have stress at work Plan:     Physical in 1 year

## 2024-05-07 ENCOUNTER — Ambulatory Visit: Payer: Self-pay | Admitting: Adult Health

## 2024-05-07 LAB — CERVICOVAGINAL ANCILLARY ONLY
Candida Glabrata: NEGATIVE
Candida Vaginitis: NEGATIVE
Comment: NEGATIVE
Comment: NEGATIVE

## 2024-05-07 LAB — CYTOLOGY - PAP
Chlamydia: NEGATIVE
Comment: NEGATIVE
Comment: NEGATIVE
Comment: NORMAL
Diagnosis: NEGATIVE
High risk HPV: NEGATIVE
Neisseria Gonorrhea: NEGATIVE

## 2024-07-14 ENCOUNTER — Encounter: Payer: Self-pay | Admitting: Radiology
